# Patient Record
Sex: Female | Born: 1935 | Race: White | Hispanic: No | Marital: Married | Smoking: Never smoker
Health system: Southern US, Community
[De-identification: ages and names within clinical notes are randomized; demographics above are authoritative.]

## PROBLEM LIST (undated history)

## (undated) DIAGNOSIS — M1990 Unspecified osteoarthritis, unspecified site: Secondary | ICD-10-CM

## (undated) DIAGNOSIS — F329 Major depressive disorder, single episode, unspecified: Secondary | ICD-10-CM

## (undated) DIAGNOSIS — F32A Depression, unspecified: Secondary | ICD-10-CM

## (undated) DIAGNOSIS — H04123 Dry eye syndrome of bilateral lacrimal glands: Secondary | ICD-10-CM

## (undated) DIAGNOSIS — K5900 Constipation, unspecified: Secondary | ICD-10-CM

## (undated) DIAGNOSIS — M459 Ankylosing spondylitis of unspecified sites in spine: Secondary | ICD-10-CM

## (undated) DIAGNOSIS — I10 Essential (primary) hypertension: Secondary | ICD-10-CM

## (undated) HISTORY — PX: NECK DISSECTION: SUR422

## (undated) HISTORY — DX: Essential (primary) hypertension: I10

## (undated) HISTORY — DX: Major depressive disorder, single episode, unspecified: F32.9

## (undated) HISTORY — PX: TONSILLECTOMY: SUR1361

## (undated) HISTORY — DX: Depression, unspecified: F32.A

## (undated) HISTORY — PX: SKIN CANCER EXCISION: SHX779

## (undated) HISTORY — PX: ABDOMINAL HYSTERECTOMY: SHX81

## (undated) HISTORY — DX: Constipation, unspecified: K59.00

## (undated) HISTORY — DX: Ankylosing spondylitis of unspecified sites in spine: M45.9

## (undated) HISTORY — DX: Dry eye syndrome of bilateral lacrimal glands: H04.123

## (undated) HISTORY — PX: APPENDECTOMY: SHX54

## (undated) HISTORY — DX: Unspecified osteoarthritis, unspecified site: M19.90

## (undated) HISTORY — PX: JOINT REPLACEMENT: SHX530

---

## 2008-04-01 ENCOUNTER — Encounter: Admission: RE | Admit: 2008-04-01 | Discharge: 2008-04-01 | Payer: Self-pay | Admitting: Internal Medicine

## 2013-03-23 ENCOUNTER — Ambulatory Visit (INDEPENDENT_AMBULATORY_CARE_PROVIDER_SITE_OTHER): Payer: Medicare Other

## 2013-03-23 VITALS — BP 127/67 | HR 61 | Resp 16 | Ht 66.5 in | Wt 120.0 lb

## 2013-03-23 DIAGNOSIS — M7750 Other enthesopathy of unspecified foot: Secondary | ICD-10-CM

## 2013-03-23 DIAGNOSIS — M722 Plantar fascial fibromatosis: Secondary | ICD-10-CM

## 2013-03-23 NOTE — Progress Notes (Signed)
   Subjective:    Patient ID: Leah Jackson, female    DOB: November 25, 1934, 78 y.o.   MRN: [id number]  "My orthotics are wearing through."  HPI Comments: N  Worn a hole in one L  Orthotics D  2010 O  Gradually  C  Gotten  worse A  None  T  None       Review of Systems  Constitutional: Negative.   Eyes: Negative.   Endocrine: Positive for cold intolerance.  Musculoskeletal: Positive for arthralgias, back pain, neck pain and neck stiffness.  Hematological: Bruises/bleeds easily.  All other systems reviewed and are negative.       Objective:   Physical Exam Neurovascular status is intact with pedal pulses palpable bilateral Refill time 3 seconds all digits. Patient semirigid digital contractures with capsulitis of the metatarsal areas posteriorly neuroma-type symptomology versus capsulitis. The previous orthotics are worn and breaking down the Spenco top cover has been.considerably patient had felt padding as an attempt to help cushion the orthoses orthotics need replacing at this time. Patient by his Medicare likely not covering orthotics her secondary insurance may or may not help with coverage. Patient likes files with orthotic scan at this time. For you orthoses my recommendation will be Spenco top cover with help with cushioning preventing keratoses. Patient also had plantar fascial symptomology tenderness Magan plantar fascia bilateral to manage with the orthoses successfully. No other cocking factors biomechanically foot is otherwise rectus     Assessment & Plan:  Assessment this time capsulitis with plantar fascial symptomology is well managed with biomechanical orthotic use. At this time it skin for new functional orthoses plan for Spenco top cover with met pads being applied to the orthotics patient may recheck in one month with orthotics ready for fitting and dispensing.  Alvan Dame DPM

## 2013-03-23 NOTE — Patient Instructions (Signed)

## 2013-05-03 ENCOUNTER — Ambulatory Visit: Payer: BLUE CROSS/BLUE SHIELD

## 2013-05-10 ENCOUNTER — Ambulatory Visit (INDEPENDENT_AMBULATORY_CARE_PROVIDER_SITE_OTHER): Payer: Medicare Other

## 2013-05-10 VITALS — BP 148/69 | HR 70 | Resp 16

## 2013-05-10 DIAGNOSIS — M722 Plantar fascial fibromatosis: Secondary | ICD-10-CM

## 2013-05-10 NOTE — Patient Instructions (Signed)

## 2013-05-10 NOTE — Progress Notes (Signed)
   Subjective:    Patient ID: Leah Jackson, female    DOB: Feb 13, 1935, 79 y.o.   MRN: [id number]  HPI Comments: "My feet haven't really changed much. They still bother me"  Pt presents today to pick up orthotics. I reviewed the wearing instructions with her and a copy will be given to her at checkout.     Review of Systems no new changes or findings     Objective:   Physical Exam New orthotics fit and contour well neurovascular status intact patient has a history of plantar fasciitis/heel spur syndrome the orthotics are left for refurbishing recover with any Spenco top cover patient be called when those are ready for fitting and dispensing is 90 visit for that followup       Assessment & Plan:  Assessment plantar fasciitis/heel spur syndrome responding well to orthoses  Orthotics are dispensed with  wearing instructions been given a fit and contour well. Retained old orthotics for refurbishing with a new Spenco top cover. Contact patient when ready for pick up. Otherwise consider a two-month long-term followup .  Alvan Dameichard Zalmen Wrightsman DPM

## 2013-05-26 DIAGNOSIS — M722 Plantar fascial fibromatosis: Secondary | ICD-10-CM

## 2013-06-01 ENCOUNTER — Telehealth: Payer: Self-pay | Admitting: *Deleted

## 2013-06-01 NOTE — Telephone Encounter (Signed)
Pt states her 2 pair of orthotics came back from refurbishing 2 different sizes and too thick.

## 2013-06-08 NOTE — Telephone Encounter (Signed)
Leah Jackson,      This patient stated she did not receive a call back in a week about this since she called until I just called her. She is unhappy and is now going elsewhere to be treated.  Shanda BumpsJessica

## 2013-06-08 NOTE — Telephone Encounter (Signed)
Patient needs an appt to check both orthotics. Okay to put on the Friday scan schedule

## 2014-01-31 ENCOUNTER — Ambulatory Visit (INDEPENDENT_AMBULATORY_CARE_PROVIDER_SITE_OTHER): Payer: Medicare Other

## 2014-01-31 VITALS — BP 128/78 | HR 74 | Resp 16

## 2014-01-31 DIAGNOSIS — R52 Pain, unspecified: Secondary | ICD-10-CM

## 2014-01-31 NOTE — Progress Notes (Signed)
   Subjective:    Patient ID: Leah Jackson, female    DOB: 20-Mar-1935, 79 y.o.   MRN: [id number]  HPI Bilateral foot pain, worsening in the right foot, bruise on left 2nd met, received pedicure yesterday. Left foot pain is new problem   Review of Systems  All other systems reviewed and are negative.      Objective:   Physical Exam [age] white female well-developed well-nourished oriented x3 presents this time with continued pain of the foot has spurring dorsal first metatarsal base area history of rheumatoid arthropathy digital contractures previous bunion surgeries bilateral has rigid contractures lesser digits with ecchymosis of the second toe left foot. 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 Patient currently taking multiple medications including reviewed rheumatoid arthritis medications prednisone fascial patches. At this time there is atrophy of fat pad pedal pulses are palpable DP postal for PT one over 4 capillary refill time 3 seconds epicritic sensations appear to be intact to plantar response. Patient does have orthotics are doing well may need replacement of orthotics at some point in the future she'll call us when she is ready for orthotic fitting in the orthotics are dispensed at some point in the future. At this time dispensed him to foam padding to second toes. X-rays were reviewed reveal healed of bunion surgeries bilateral rigid digital contractures lesser digits generalized osteopenic changes intra-articular spurring arthropathy the Lisfranc's and mid tarsus joints.       Assessment & Plan:  Assessment exostosis and capsulitis Lisfranc joint bilateral right more so than left at this time dispensed 1 silicone pads resident padding the plantar foot where padding the dorsum of the  foot and instep dispensed to foam padding for hammertoe deformity second bilateral. Maintain appropriate accommodative shoes patient of the time she fitting to the shape of her foot significant arthropathy the forefoot on direct lateral compression interspaces and MTP joints and cells consistent with rheumatoid arthritis as well as early neuropathy symptoms. Dispensed and silicone pads and to foam padding recheck in the future as needed may be candidate for new orthoses at some point in the future as well this  Harriet Masson DPM

## 2014-08-02 ENCOUNTER — Ambulatory Visit
Admission: RE | Admit: 2014-08-02 | Discharge: 2014-08-02 | Disposition: A | Discharge status: Home / Self Care | Payer: Medicare Other | Source: Ambulatory Visit | Attending: Neurosurgery | Admitting: Neurosurgery

## 2014-08-02 ENCOUNTER — Other Ambulatory Visit: Payer: Self-pay | Admitting: Neurosurgery

## 2014-08-02 DIAGNOSIS — M419 Scoliosis, unspecified: Secondary | ICD-10-CM

## 2014-08-08 ENCOUNTER — Other Ambulatory Visit: Payer: Self-pay | Admitting: Neurosurgery

## 2014-08-17 ENCOUNTER — Encounter (HOSPITAL_COMMUNITY): Payer: Self-pay

## 2014-08-17 ENCOUNTER — Encounter (HOSPITAL_COMMUNITY): Payer: Self-pay | Admitting: Vascular Surgery

## 2014-08-17 NOTE — Progress Notes (Signed)
Anesthesia Note: Patient is a 80 year old female scheduled for L3-4, L4-5 MAS, PLIF on 09/05/14 by Dr. Venetia Jackson.  Her PAT is scheduled for 08/29/14 at 1PM.  Currently available history includes ankylosing spondylitis, arthritis, HTN, depression, non-smoker, hysterectomy, appendectomy, skin cancer (melanoma), neck dissection, right THA, cholecystectomy 02/14/14. PCP Dr. Hope Pigeonebecca Jackson. GI Dr. Bethena RoysWilliam Jackson.   Her Rheumatologist Dr. Louann LivKenneth Jackson sent the following pre-operative recommendations (can also be viewed under Care Everywhere): 1. Stress coverage with steroids given her longstanding po daily prednisone: per your protocol 2. Pre-op c-spine films with flexion/extension films to exclude sublinical C1/2 subluxation (can be seen in AS, just as in RA) 3. Surgery would be best timed to be no sooner than 3 weeks after the last Remicade; the next post-op Remicade infusion should be no sooner than 1 week post-op, longer if any post-op (eg infectious) complications 4. Arava (leflunomide) can be continued - hold only for post-op infection  Leah BumpsJessica at Dr Leah Jackson's office was unsure of timing of patient's c-spine films.  She will discuss with Dr. Venetia Jackson, but IF C-SPINE FLEX-EXT XRAY NOT DONE PRIOR TO PAT THEN IT WILL NEED TO BE DONE AT HER 08/29/14 APPT.  Leah Ochsllison Kyelle Urbas, PA-C [facility] Phone [phone] 08/17/2014 12:36 PM

## 2014-08-29 ENCOUNTER — Inpatient Hospital Stay (HOSPITAL_COMMUNITY)
Admission: RE | Admit: 2014-08-29 | Discharge: 2014-08-29 | Disposition: A | Discharge status: Home / Self Care | Payer: Medicare Other | Source: Ambulatory Visit

## 2014-09-05 ENCOUNTER — Encounter (HOSPITAL_COMMUNITY): Admission: RE | Payer: Medicare Other | Source: Ambulatory Visit

## 2014-09-05 ENCOUNTER — Inpatient Hospital Stay (HOSPITAL_COMMUNITY): Admission: RE | Admit: 2014-09-05 | Payer: Medicare Other | Source: Ambulatory Visit | Admitting: Neurosurgery

## 2014-09-05 SURGERY — FOR MAXIMUM ACCESS (MAS) POSTERIOR LUMBAR INTERBODY FUSION (PLIF) 2 LEVEL
Anesthesia: General | Site: Back

## 2015-03-13 ENCOUNTER — Other Ambulatory Visit: Payer: Self-pay | Admitting: Neurosurgery

## 2015-03-13 DIAGNOSIS — M48061 Spinal stenosis, lumbar region without neurogenic claudication: Secondary | ICD-10-CM

## 2015-03-13 DIAGNOSIS — M5412 Radiculopathy, cervical region: Secondary | ICD-10-CM

## 2015-04-01 ENCOUNTER — Ambulatory Visit
Admission: RE | Admit: 2015-04-01 | Discharge: 2015-04-01 | Disposition: A | Discharge status: Home / Self Care | Payer: Medicare Other | Source: Ambulatory Visit | Attending: Neurosurgery | Admitting: Neurosurgery

## 2015-04-01 DIAGNOSIS — M5412 Radiculopathy, cervical region: Secondary | ICD-10-CM

## 2015-04-01 DIAGNOSIS — M48061 Spinal stenosis, lumbar region without neurogenic claudication: Secondary | ICD-10-CM

## 2017-02-24 IMAGING — MR MR LUMBAR SPINE W/O CM
4 of 5 series · 24 of 48 positions shown · non-contrast
Comparison: CT Abdomen and Pelvis 01/12/2015. Tristan Joseph Descallar
Lumbar MRI 04/29/2014, available on [HOSPITAL] PACS .

CLINICAL DATA: 80-year-old female with right side lumbar back pain
radiating to the lower extremity, chronic but symptoms increasing.
Subsequent encounter.

EXAM:
MRI LUMBAR SPINE WITHOUT CONTRAST
TECHNIQUE: Multiplanar, multisequence MR imaging of the lumbar spine was
performed. No intravenous contrast was administered.

[Series 3: T2 · sagittal · 4.0mm · 0.44mm/px · 6 of 14 slices shown (1 of 2)]
[im 1/14]
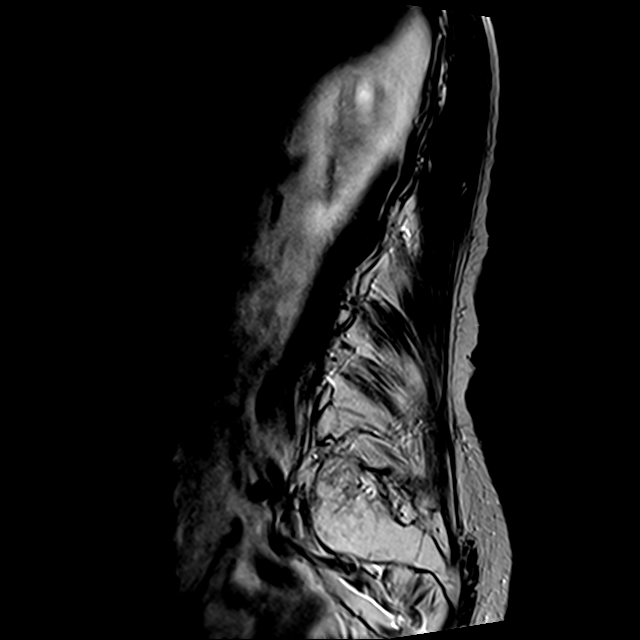
[im 3/14]
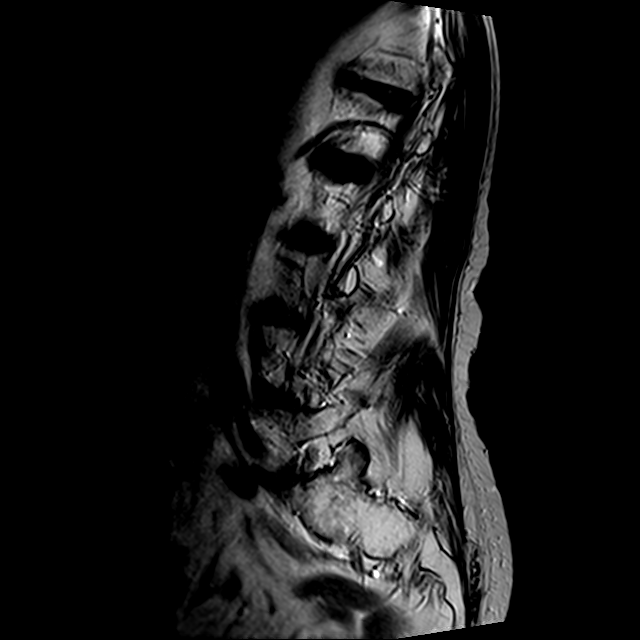
[im 6/14]
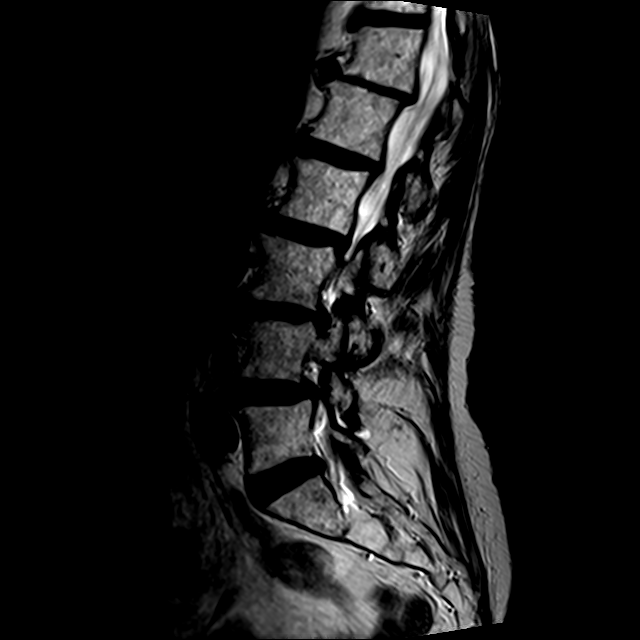
[im 8/14]
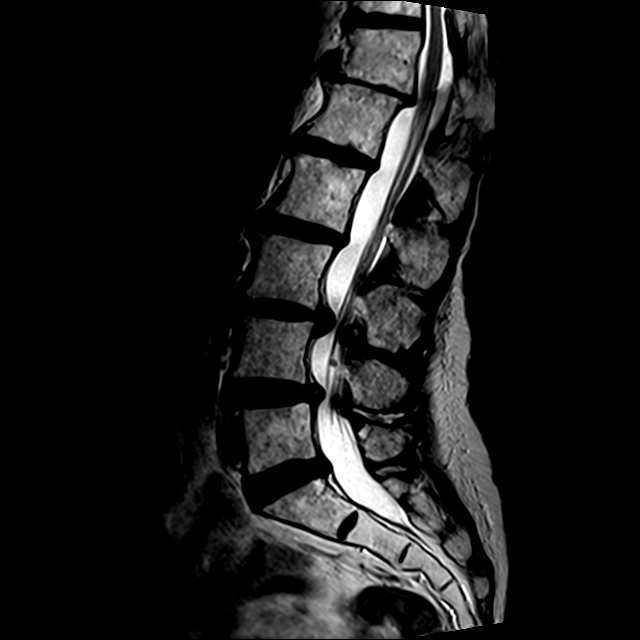
[im 11/14]
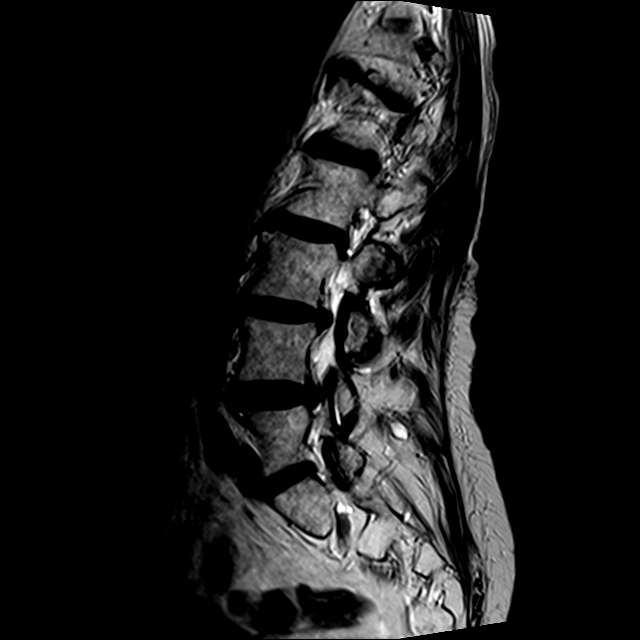
[im 14/14]
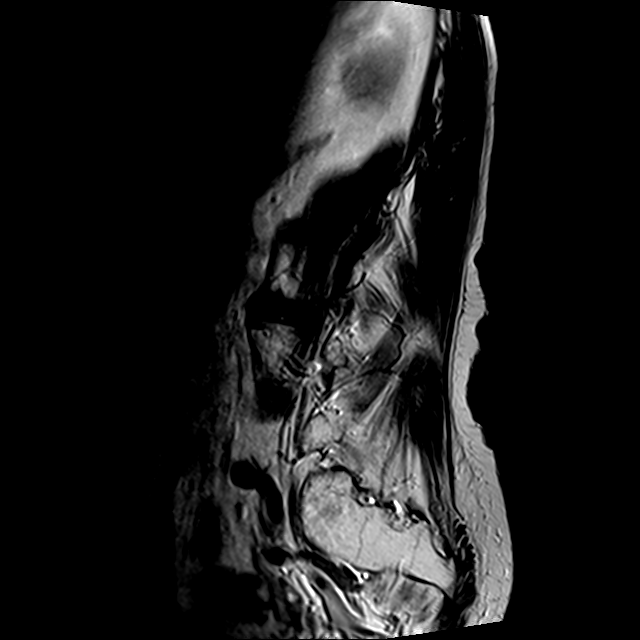

[Series 4: T1 · sagittal · 4.0mm · 0.88mm/px · 6 of 14 slices shown (1 of 2)]
[im 1/14]
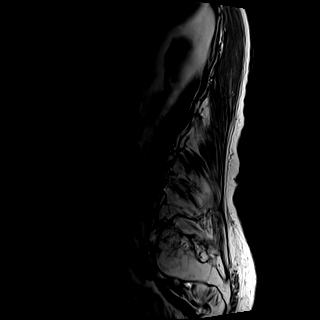
[im 3/14]
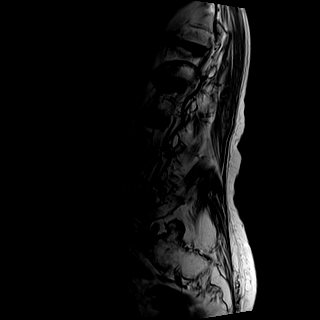
[im 6/14]
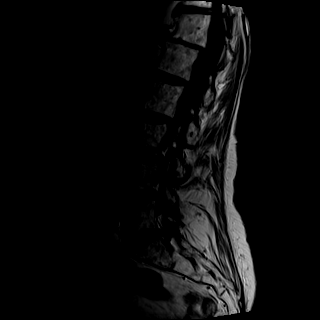
[im 8/14]
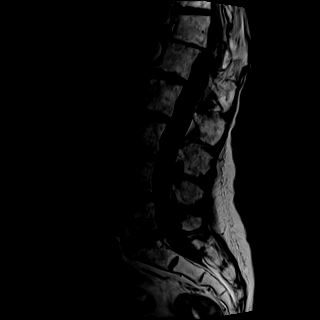
[im 11/14]
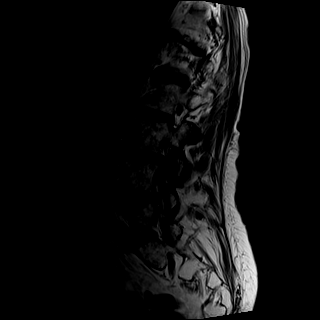
[im 14/14]
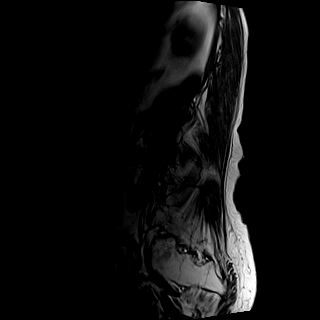

[Series 5: T1 · axial · 4.0mm · 0.37mm/px · z∈[-80,+56]mm · 3 of 33 slices shown (2 of 2)]
[im 5/33]
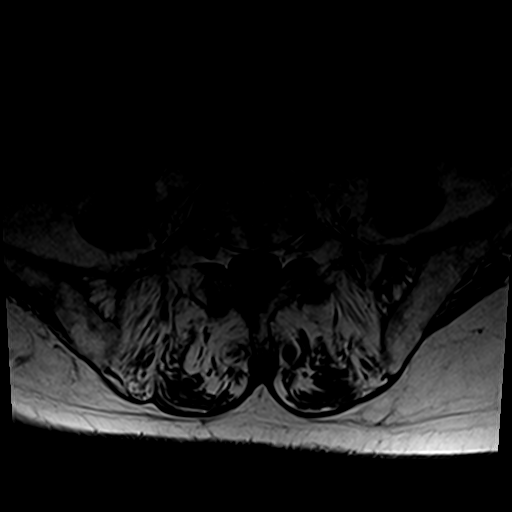
[im 17/33]
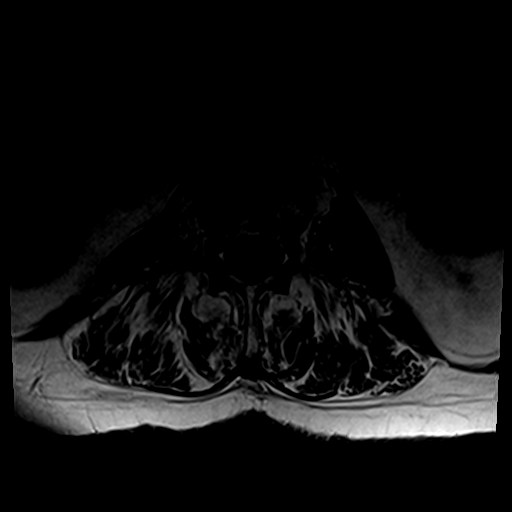
[im 28/33]
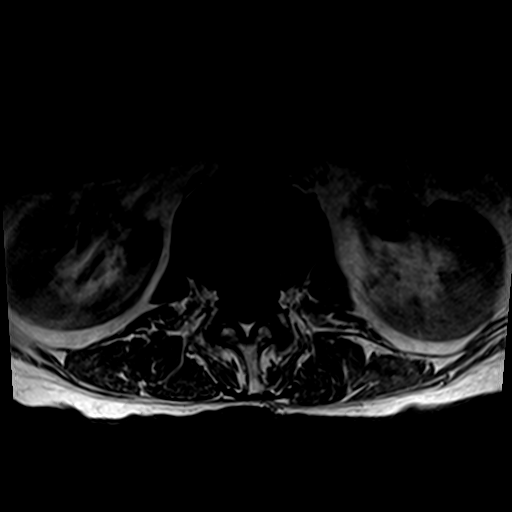

[Series 7: T2 · axial · 4.0mm · 0.74mm/px · z∈[-100,+109]mm · 9 of 33 slices shown (2 of 2)]
[im 1/33]
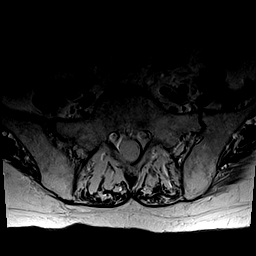
[im 5/33]
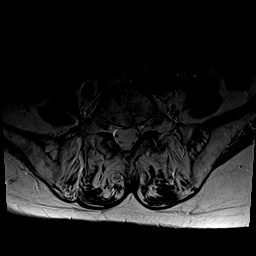
[im 10/33]
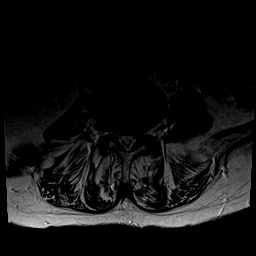
[im 14/33]
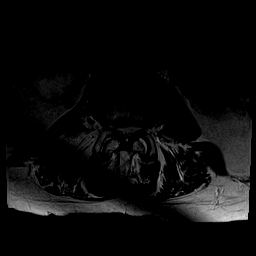
[im 17/33]
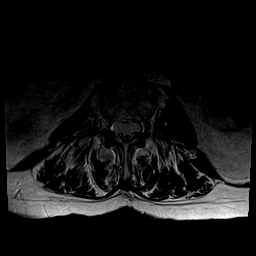
[im 19/33]
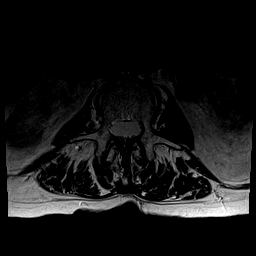
[im 23/33]
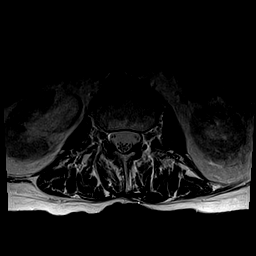
[im 28/33]
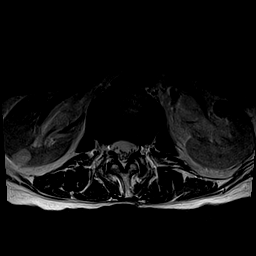
[im 33/33]
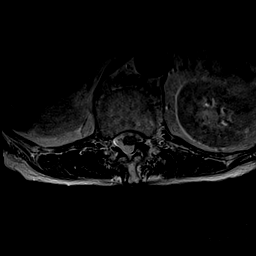

[24 of 48 positions shown; findings below may reference images not displayed]

FINDINGS: Normal lumbar segmentation demonstrated on the recent CT. Mild
anterolisthesis at L4-L5 is stable. Mild retrolisthesis at T12-L1 is
stable. No marrow edema or evidence of acute osseous abnormality.
Visible sacrum intact.

Visualized lower thoracic spinal cord is normal with conus medularis
at L1. Cauda equina nerve roots - aside from the stenosis described
below - appear normal except at the L4 level to the right of midline
where a 2-3 mm nodule is re- identified (series 3, image 8 and
series 7, image 22). This was less apparent on the prior but is seen
on series 7, image 19 of the comparison, and is unchanged.

Stable visualized abdominal viscera. Negative visualized posterior
paraspinal soft tissues.

T11-T12: Stable and negative aside from ligament flavum hypertrophy.

T12-L1: Chronic severe disc space loss with retrolisthesis and left
eccentric circumferential disc osteophyte complex. Stable mild facet
hypertrophy. Stable mild left lateral recess stenosis and left T12
foraminal stenosis.

L1-L2: Stable mild disc bulge and mild to moderate facet
hypertrophy.

L2-L3: Stable mild disc bulge and mild to moderate facet
hypertrophy. Borderline to mild right lateral recess stenosis is
stable.

L3-L4: Disc space loss with vacuum disc. Right eccentric
circumferential disc bulge with superimposed broad-based central
disc extrusion, eccentric to the right. This appears stable since
[REDACTED]. Superimposed moderate to severe facet and mild ligament
flavum hypertrophy. Increased right side facet joint fluid. Severe
right lateral recess stenosis is stable. Mild spinal stenosis is
stable. Disc extension into both L3 neural foramina with mild to
moderate bilateral foraminal stenosis is stable.

L4-L5: Mild anterolisthesis. Circumferential disc bulge with
broad-based posterior component which includes an annular fissure
appears stable since [REDACTED]. Moderate facet and ligament flavum
hypertrophy is stable. Stable facet joint fluid. Stable fluid signal
in the interspinous ligament (series 6, image 19). Stable moderate
spinal stenosis with moderate to severe bilateral lateral recess
stenosis. Disc material again involves the right L4 foramen. Stable
mild to moderate right foraminal stenosis.

L5-S1:  Stable mild central disc protrusion.  No stenosis.
IMPRESSION: 1. Stable lumbar spine since [REDACTED]:
Multifactorial severe right lateral recess, mild spinal, and mild to
moderate foraminal stenosis at L3-L4, and
L4-L5 Moderate spinal with moderate to severe bilateral lateral
recess stenosis and mild to moderate right foraminal stenosis.
2. Small chronic 2-3 mm soft tissue nodule associated with the right
cauda equina at the L4 vertebral level, better visualized today but
stable. Favor tiny benign nerve sheath tumor.
